# Patient Record
Sex: Female | Born: 1995 | Hispanic: No | Marital: Single
Health system: Southern US, Community
[De-identification: ages and names within clinical notes are randomized; demographics above are authoritative.]

## PROBLEM LIST (undated history)

## (undated) DIAGNOSIS — J45909 Unspecified asthma, uncomplicated: Secondary | ICD-10-CM

## (undated) DIAGNOSIS — E282 Polycystic ovarian syndrome: Secondary | ICD-10-CM

---

## 2017-05-19 ENCOUNTER — Emergency Department: Payer: 59

## 2017-05-19 ENCOUNTER — Encounter: Payer: Self-pay | Admitting: Emergency Medicine

## 2017-05-19 ENCOUNTER — Emergency Department
Admission: EM | Admit: 2017-05-19 | Discharge: 2017-05-19 | Disposition: A | Discharge status: Home / Self Care | Payer: 59 | Attending: Emergency Medicine | Admitting: Emergency Medicine

## 2017-05-19 DIAGNOSIS — A419 Sepsis, unspecified organism: Secondary | ICD-10-CM | POA: Insufficient documentation

## 2017-05-19 DIAGNOSIS — R000 Tachycardia, unspecified: Secondary | ICD-10-CM | POA: Insufficient documentation

## 2017-05-19 DIAGNOSIS — I959 Hypotension, unspecified: Secondary | ICD-10-CM | POA: Diagnosis not present

## 2017-05-19 DIAGNOSIS — J45909 Unspecified asthma, uncomplicated: Secondary | ICD-10-CM | POA: Diagnosis not present

## 2017-05-19 DIAGNOSIS — R509 Fever, unspecified: Secondary | ICD-10-CM | POA: Diagnosis present

## 2017-05-19 HISTORY — DX: Unspecified asthma, uncomplicated: J45.909

## 2017-05-19 HISTORY — DX: Polycystic ovarian syndrome: E28.2

## 2017-05-19 LAB — CBC WITH DIFFERENTIAL/PLATELET
BASOS PCT: 0 %
Basophils Absolute: 0 10*3/uL (ref 0–0.1)
EOS ABS: 0 10*3/uL (ref 0–0.7)
EOS PCT: 0 %
HCT: 33.4 % — ABNORMAL LOW (ref 35.0–47.0)
HEMOGLOBIN: 11 g/dL — AB (ref 12.0–16.0)
LYMPHS ABS: 1.2 10*3/uL (ref 1.0–3.6)
Lymphocytes Relative: 8 %
MCH: 24.6 pg — AB (ref 26.0–34.0)
MCHC: 32.9 g/dL (ref 32.0–36.0)
MCV: 74.9 fL — ABNORMAL LOW (ref 80.0–100.0)
MONO ABS: 0.5 10*3/uL (ref 0.2–0.9)
MONOS PCT: 3 %
Neutro Abs: 13.5 10*3/uL — ABNORMAL HIGH (ref 1.4–6.5)
Neutrophils Relative %: 89 %
Platelets: 372 10*3/uL (ref 150–440)
RBC: 4.46 MIL/uL (ref 3.80–5.20)
RDW: 16.3 % — AB (ref 11.5–14.5)
WBC: 15.2 10*3/uL — ABNORMAL HIGH (ref 3.6–11.0)

## 2017-05-19 LAB — URINALYSIS, COMPLETE (UACMP) WITH MICROSCOPIC
Bacteria, UA: NONE SEEN
Bilirubin Urine: NEGATIVE
GLUCOSE, UA: NEGATIVE mg/dL
KETONES UR: 20 mg/dL — AB
Leukocytes, UA: NEGATIVE
NITRITE: NEGATIVE
PH: 6 (ref 5.0–8.0)
PROTEIN: 100 mg/dL — AB
Specific Gravity, Urine: 1.032 — ABNORMAL HIGH (ref 1.005–1.030)

## 2017-05-19 LAB — COMPREHENSIVE METABOLIC PANEL
ALK PHOS: 49 U/L (ref 38–126)
ALT: 10 U/L — AB (ref 14–54)
AST: 24 U/L (ref 15–41)
Albumin: 3.8 g/dL (ref 3.5–5.0)
Anion gap: 9 (ref 5–15)
BUN: 12 mg/dL (ref 6–20)
CALCIUM: 8.9 mg/dL (ref 8.9–10.3)
CHLORIDE: 103 mmol/L (ref 101–111)
CO2: 24 mmol/L (ref 22–32)
CREATININE: 0.81 mg/dL (ref 0.44–1.00)
GFR calc non Af Amer: >60 mL/min (ref >60)
GLUCOSE: 107 mg/dL — AB (ref 65–99)
Potassium: 3.7 mmol/L (ref 3.5–5.1)
SODIUM: 136 mmol/L (ref 135–145)
Total Bilirubin: 0.7 mg/dL (ref 0.3–1.2)
Total Protein: 8.3 g/dL — ABNORMAL HIGH (ref 6.5–8.1)

## 2017-05-19 LAB — MONONUCLEOSIS SCREEN: MONO SCREEN: NEGATIVE

## 2017-05-19 LAB — POCT PREGNANCY, URINE: Preg Test, Ur: NEGATIVE

## 2017-05-19 LAB — LACTIC ACID, PLASMA: LACTIC ACID, VENOUS: 1.7 mmol/L (ref 0.5–1.9)

## 2017-05-19 MED ORDER — VANCOMYCIN HCL 10 G IV SOLR: 1000.0000 mg | Freq: Once | INTRAVENOUS | Status: DC

## 2017-05-19 MED ORDER — PIPERACILLIN-TAZOBACTAM 3.375 G IVPB 30 MIN
INTRAVENOUS | Status: AC
  Administered 2017-05-19: 3.375 g via INTRAVENOUS
  Filled 2017-05-19: qty 50

## 2017-05-19 MED ORDER — SODIUM CHLORIDE 0.9 % IV BOLUS (SEPSIS)
1000.0000 mL | Freq: Once | INTRAVENOUS | Status: AC
  Administered 2017-05-19: 1000 mL via INTRAVENOUS

## 2017-05-19 MED ORDER — VANCOMYCIN HCL IN DEXTROSE 1-5 GM/200ML-% IV SOLN
1000.0000 mg | Freq: Once | INTRAVENOUS | Status: AC
  Administered 2017-05-19: 1000 mg via INTRAVENOUS
  Filled 2017-05-19: qty 200

## 2017-05-19 MED ORDER — ONDANSETRON HCL 4 MG/2ML IJ SOLN
4.0000 mg | Freq: Once | INTRAMUSCULAR | Status: AC
  Administered 2017-05-19: 4 mg via INTRAVENOUS
  Filled 2017-05-19: qty 2

## 2017-05-19 MED ORDER — ACETAMINOPHEN 500 MG PO TABS
1000.0000 mg | ORAL_TABLET | Freq: Once | ORAL | Status: AC
  Administered 2017-05-19: 1000 mg via ORAL
  Filled 2017-05-19: qty 2

## 2017-05-19 MED ORDER — CIPROFLOXACIN HCL 250 MG PO TABS: 250.0000 mg | ORAL_TABLET | Freq: Two times a day (BID) | ORAL | 0 refills | Status: DC

## 2017-05-19 MED ORDER — CIPROFLOXACIN HCL 250 MG PO TABS: 250.0000 mg | ORAL_TABLET | Freq: Two times a day (BID) | ORAL | 0 refills | Status: AC

## 2017-05-19 MED ORDER — PIPERACILLIN-TAZOBACTAM 3.375 G IVPB 30 MIN
INTRAVENOUS | Status: AC
  Filled 2017-05-19: qty 50

## 2017-05-19 MED ORDER — KETOROLAC TROMETHAMINE 30 MG/ML IJ SOLN
30.0000 mg | Freq: Once | INTRAMUSCULAR | Status: AC
  Administered 2017-05-19: 30 mg via INTRAVENOUS
  Filled 2017-05-19: qty 1

## 2017-05-19 MED ORDER — PIPERACILLIN-TAZOBACTAM 3.375 G IVPB 30 MIN
3.3750 g | Freq: Once | INTRAVENOUS | Status: AC
  Administered 2017-05-19: 3.375 g via INTRAVENOUS

## 2017-05-19 NOTE — ED Provider Notes (Signed)
[facility] Emergency Department Provider Note  ____________________________________________  Time seen: Approximately 12:13 PM  I have reviewed the triage vital signs and the nursing notes.   HISTORY  Chief Complaint Fever and Generalized Body Aches    HPI Laurie Sampson is a 21 y.o. female with a history of asthma and PCOS sent from clinic for hypotension, fever, tachycardia. The patient reports that since yesterday she has been feeling generalized body aches with malaise and fatigue. She did have one episode of nausea and vomiting but has not had any abdominal pain, diarrhea or constipation. She is not had any cough or cold symptoms, sore throat, ear pain, urinary frequency or dysuria. She did travel to [place] in [place] one month ago. No known sick contacts, rash or tick bites. The patient is menstruating at this time but is not wearing a tampon. At the urgent care, the patient had a blood pressure reading as low as 60/40, temperature of 88.6, and heart rate of 132.   Past Medical History:  Diagnosis Date  . Asthma   . Polycystic ovarian syndrome     There are no active problems to display for this patient.   History reviewed. No pertinent surgical history.  Current Outpatient Rx  . Order #: [id number] Class: Historical Med    Allergies Other  No family history on file.  Social History Social History  Substance Use Topics  . Smoking status: Not on file  . Smokeless tobacco: Not on file  . Alcohol use Not on file    Review of Systems Constitutional:Positive fever and chills. Positive generalized body aches. Positive generalized malaise. Eyes: No visual changes. No eye discharge. ENT: No sore throat. No congestion or rhinorrhea. No ear pain. Cardiovascular: Denies chest pain. Denies palpitations. Respiratory: Denies shortness of breath.  No cough. Gastrointestinal: No abdominal pain.  Positive nausea, positive single episode of vomiting.  No  diarrhea.  No constipation. Genitourinary: Negative for dysuria. Urinary frequency. No hematuria. Musculoskeletal: Negative for focal back pain. Skin: Negative for rash. Neurological: Negative for headaches. No focal numbness, tingling or weakness.     ____________________________________________   PHYSICAL EXAM:  VITAL SIGNS: ED Triage Vitals [05/19/17 1036]  Enc Vitals Group     BP 101/60     Pulse Rate (!) 134     Resp 20     Temp (!) 102.8 F (39.3 C)     Temp Source Oral     SpO2 97 %     Weight 100 lb (45.4 kg)     Height 4\' 11"  (1.499 m)     Head Circumference      Peak Flow      Pain Score 8     Pain Loc      Pain Edu?      Excl. in GC?     Constitutional: Alert and oriented. Well appearing and in no acute distress. Answers questions appropriately.Lying comfortably in the stretcher, able to sit up, and speaking comfortably. Eyes: Conjunctivae are normal.  EOMI. No scleral icterus. No eye discharge. Head: Atraumatic. Nose: No congestion/rhinnorhea. Mouth/Throat: Mucous membranes are moist. No posterior pharyngeal erythema, tonsillar swelling or exudate. The posterior palate is symmetric and uvula is midline. No evidence of dental infection. Neck: No stridor.  Supple.  No meningismus. Full range of motion without pain. Cardiovascular: Fast rate, regular rhythm. No murmurs, rubs or gallops.  Respiratory: Normal respiratory effort.  No accessory muscle use or retractions. Lungs CTAB.  No wheezes, rales or ronchi.  Gastrointestinal: Soft, nontender and nondistended.  No guarding or rebound.  No peritoneal signs. Musculoskeletal: No LE edema. No swollen or erythematous joints. The patient has diffuse back pain that is not focal in the costovertebral angles. Neurologic:  A&Ox3.  Speech is clear.  Face and smile are symmetric.  EOMI.  Moves all extremities well. Skin:  Skin is warm, dry and intact. No rash noted. Psychiatric: Mood and affect are normal. Speech and behavior  are normal.  Normal judgement.  ____________________________________________   LABS (all labs ordered are listed, but only abnormal results are displayed)  Labs Reviewed  COMPREHENSIVE METABOLIC PANEL - Abnormal; Notable for the following:       Result Value   Glucose, Bld 107 (*)    Total Protein 8.3 (*)    ALT 10 (*)    All other components within normal limits  CBC WITH DIFFERENTIAL/PLATELET - Abnormal; Notable for the following:    WBC 15.2 (*)    Hemoglobin 11.0 (*)    HCT 33.4 (*)    MCV 74.9 (*)    MCH 24.6 (*)    RDW 16.3 (*)    Neutro Abs 13.5 (*)    All other components within normal limits  URINALYSIS, COMPLETE (UACMP) WITH MICROSCOPIC - Abnormal; Notable for the following:    Color, Urine YELLOW (*)    APPearance HAZY (*)    Specific Gravity, Urine 1.032 (*)    Hgb urine dipstick MODERATE (*)    Ketones, ur 20 (*)    Protein, ur 100 (*)    Squamous Epithelial / LPF 0-5 (*)    Non Squamous Epithelial 0-5 (*)    All other components within normal limits  CULTURE, BLOOD (ROUTINE X 2)  CULTURE, BLOOD (ROUTINE X 2)  LACTIC ACID, PLASMA  LACTIC ACID, PLASMA  MONONUCLEOSIS SCREEN  POC URINE PREG, ED  POCT PREGNANCY, URINE   ____________________________________________  EKG  ED ECG REPORT I, Rockne Menghini, the attending physician, personally viewed and interpreted this ECG.   Date: 05/19/2017  EKG Time: 1129  Rate: 120  Rhythm: normal EKG, normal sinus rhythm, unchanged from previous tracings, sinus tachycardia  Axis: normal  Intervals:none  ST&T Change: No STEMI  ____________________________________________  RADIOLOGY  Dg Chest 2 View  Result Date: 05/19/2017 CLINICAL DATA:  Fever with dizziness and nausea EXAM: CHEST  2 VIEW COMPARISON:  None. FINDINGS: Lungs are clear. Heart size and pulmonary vascularity are normal. No adenopathy. There is mild midthoracic levoscoliosis. IMPRESSION: No edema or consolidation. Electronically Signed   By:  Bretta Bang III M.D.   On: 05/19/2017 11:27    ____________________________________________   PROCEDURES  Procedure(s) performed: None  Procedures  Critical Care performed: Yes, see critical care note(s) ____________________________________________   INITIAL IMPRESSION / ASSESSMENT AND PLAN / ED COURSE  Pertinent labs & imaging results that were available during my care of the patient were reviewed by me and considered in my medical decision making (see chart for details).  21 y.o. female, otherwise healthy, presenting with fever, hypotension and tachycardia, 1 episode of vomiting. Overall, she does not have an obvious focal source of infection. There is no evidence for pulmonary or HEENT infection. She is not wearing a tampon although she is menstruating so toxic shock syndrome is unlikely. She had 1 episode of vomiting, but without any diarrhea or abdominal pain, and acute intra-abdominal infection is also unlikely unless her symptoms of fever or preceding GI symptoms. I'm awaiting the results of her urinalysis but she  is not having any urinary symptoms at this time. The patient will receive broad-spectrum antibiotics if her urinalysis is negative and be admitted to the hospital for further evaluation and treatment.  ----------------------------------------- 12:47 PM on 05/19/2017 -----------------------------------------  The patient's urinalysis does not show infection. Broad-spectrum antibiotics have been ordered. The patient will be admitted to the hospital for further evaluation and treatment.  CRITICAL CARE Performed by: Rockne Menghini   Total critical care time: 35 minutes  Critical care time was exclusive of separately billable procedures and treating other patients.  Critical care was necessary to treat or prevent imminent or life-threatening deterioration.  Critical care was time spent personally by me on the following activities: development of  treatment plan with patient and/or surrogate as well as nursing, discussions with consultants, evaluation of patient's response to treatment, examination of patient, obtaining history from patient or surrogate, ordering and performing treatments and interventions, ordering and review of laboratory studies, ordering and review of radiographic studies, pulse oximetry and re-evaluation of patient's condition.   ____________________________________________  FINAL CLINICAL IMPRESSION(S) / ED DIAGNOSES  Final diagnoses:  Sepsis, due to unspecified organism [facility])         NEW MEDICATIONS STARTED DURING THIS VISIT:  New Prescriptions   No medications on file      Rockne Menghini, MD 05/19/17 1248

## 2017-05-19 NOTE — ED Triage Notes (Signed)
Pt reports fever and generalized body aches that began yesterday. [facility] where [facility] was [facility] and tachycardic. Pt recently returned from [place] and [place].

## 2017-05-19 NOTE — Consult Note (Signed)
[facility] [place] [facility]   PATIENT NAME: Laurie Sampson    MR#:  [id number]  DATE OF BIRTH:  [birth year]  DATE OF CONSULT:  05/19/2017  PRIMARY CARE PHYSICIAN: System, Pcp Not In   REQUESTING/REFERRING PHYSICIAN: Dr. Sharma Covert.   CHIEF COMPLAINT:   Chief Complaint  Patient presents with  . Fever  . Generalized Body Aches    HISTORY OF PRESENT ILLNESS:  Laurie Sampson  is a 21 y.o. female with a known history of polycystic ovarian syndrome, asthma who presents to the hospital due to fever and generalized body aches. Patient developed these symptoms over the past 24 hours. [facility] and was noted to be severely hypotensive and therefore sent to the ER for further evaluation. Patient was started on a sepsis protocol and given IV fluids and broad-spectrum IV antibiotics with vancomycin, Zosyn. Hospitalist services were contacted further treatment and evaluation. Patient does admit to some nausea and vomiting earlier today but no diarrhea. She denies any abdominal pain, chest pains, shortness of breath, cough congestion or any other upper respiratory symptoms. She denies any recent sick contacts. She denies any neck stiffness, blurry vision, nuchal rigidity.    PAST MEDICAL HISTORY:   Past Medical History:  Diagnosis Date  . Asthma   . Polycystic ovarian syndrome     PAST SURGICAL HISTOIRY:  History reviewed. No pertinent surgical history.  SOCIAL HISTORY:   Social History  Substance Use Topics  . Smoking status: Not on file  . Smokeless tobacco: Not on file  . Alcohol use Not on file    FAMILY HISTORY:  No family history on file.  No significant Family hx of DM, or HTN.    DRUG ALLERGIES:   Allergies  Allergen Reactions  . Other Anaphylaxis    Sesame seeds    REVIEW OF SYSTEMS:   Review of Systems  Constitutional: Positive for fever and malaise/fatigue. Negative for weight loss.  HENT: Negative for congestion,  nosebleeds and tinnitus.   Eyes: Negative for blurred vision, double vision and redness.  Respiratory: Negative for cough, hemoptysis and shortness of breath.   Cardiovascular: Negative for chest pain, orthopnea, leg swelling and PND.  Gastrointestinal: Negative for abdominal pain, diarrhea, melena, nausea and vomiting.  Genitourinary: Negative for dysuria, hematuria and urgency.  Musculoskeletal: Negative for falls and joint pain.  Neurological: Negative for dizziness, tingling, sensory change, focal weakness, seizures, weakness and headaches.  Endo/Heme/Allergies: Negative for polydipsia. Does not bruise/bleed easily.  Psychiatric/Behavioral: Negative for depression and memory loss. The patient is not nervous/anxious.      MEDICATIONS AT HOME:   Prior to Admission medications   Medication Sig Start Date End Date Taking? Authorizing Provider  JUNEL 1.5/30 1.5-30 MG-MCG tablet Take 1 tablet by mouth daily. 05/06/17  Yes [provider]  ciprofloxacin (CIPRO) 250 MG tablet Take 1 tablet (250 mg total) by mouth 2 (two) times daily. 05/19/17 05/26/17  Houston Siren, MD      VITAL SIGNS:  Blood pressure 108/86, pulse 90, temperature 99.5 F (37.5 C), temperature source Oral, resp. rate 17, height 4\' 11"  (1.499 m), weight 45.4 kg (100 lb), last menstrual period 04/22/2017, SpO2 100 %.  PHYSICAL EXAMINATION:  GENERAL:  21 y.o.-year-old patient lying in the bed with no acute distress.  EYES: Pupils equal, round, reactive to light and accommodation. No scleral icterus. Extraocular muscles intact.  HEENT: Head atraumatic, normocephalic. Oropharynx and nasopharynx clear.  NECK:  Supple, no jugular venous  distention. No thyroid enlargement, no tenderness.  LUNGS: Normal breath sounds bilaterally, no wheezing, rales, rhonchi . No use of accessory muscles of respiration.  CARDIOVASCULAR: S1, S2, RRR. No murmurs, rubs, gallops, clicks.  ABDOMEN: Soft, nontender, nondistended. Bowel sounds  present. No organomegaly or mass.  EXTREMITIES: No pedal edema, cyanosis, or clubbing.  NEUROLOGIC: Cranial nerves II through XII are intact. No focal motor or sensory deficits appreciated bilaterally  PSYCHIATRIC: The patient is alert and oriented x 3.  SKIN: No obvious rash, lesion, or ulcer.   LABORATORY PANEL:   CBC  Recent Labs Lab 05/19/17 1045  WBC 15.2*  HGB 11.0*  HCT 33.4*  PLT 372   ------------------------------------------------------------------------------------------------------------------  Chemistries   Recent Labs Lab 05/19/17 1045  NA 136  K 3.7  CL 103  CO2 24  GLUCOSE 107*  BUN 12  CREATININE 0.81  CALCIUM 8.9  AST 24  ALT 10*  ALKPHOS 49  BILITOT 0.7   ------------------------------------------------------------------------------------------------------------------  Cardiac Enzymes No results for input(s): TROPONINI in the last 168 hours. ------------------------------------------------------------------------------------------------------------------  RADIOLOGY:  Dg Chest 2 View  Result Date: 05/19/2017 CLINICAL DATA:  Fever with dizziness and nausea EXAM: CHEST  2 VIEW COMPARISON:  None. FINDINGS: Lungs are clear. Heart size and pulmonary vascularity are normal. No adenopathy. There is mild midthoracic levoscoliosis. IMPRESSION: No edema or consolidation. Electronically Signed   By: Bretta Bang III M.D.   On: 05/19/2017 11:27     IMPRESSION AND PLAN:   21 year old female with past medical history polycystic ovarian syndrome, asthma who presents to the hospital due to generalized body aches and fever.  1. Sepsis-patient meets criteria given fever, leukocytosis but the source of sepsis is still very unclear. Patient's chest x-rays negative for acute pathology, urinalysis is negative for urinary tract infection. - She has no recent sick contacts, no evidence of sexually transmitted disease, no clinical symptoms of meningitis. - She  could have a nonspecific gastroenteritis which could either be bacterial or viral in nature given the patient's leukocytosis with neutrophilic predominance. - I will empirically discharge the patient on 7 days of ciprofloxacin. Patient has received 1 dose of IV Zosyn and vancomycin on the ER. Her fever has subsided, her blood pressure has significantly improved with IV fluid hydration. She clinically feels much better.  Discussed with the patient's mother and she is in agreement but patient being discharged home on oral antibiotics and supportive care. Advised mother that if the patient gets worse to bring the patient back to the ER and she is agreeable with this plan.    All the records are reviewed and case discussed with Consulting provider. Management plans discussed with the patient, family and they are in agreement.  CODE STATUS: Full code  TOTAL TIME TAKING CARE OF THIS PATIENT: 40 minutes.    Houston Siren M.D on 05/19/2017 at 3:03 PM  Between 7am to 6pm - Pager - [phone]  After 6pm go to [email or web address] - password [email or web address]  [facility]  Office  [phone]  CC: Primary care Physician: System, Pcp Not In

## 2017-05-19 NOTE — ED Notes (Signed)
Pt and mother verbalize understanding of d/ teaching and rx. Pt in NAD at time of d/c, VS stable. Pt mother signed per pt request

## 2017-05-19 NOTE — Progress Notes (Signed)
Pharmacy Code Sepsis Note  Laurie Sampson is a 21 y.o. female admitted on 05/19/2017 with fever and body aches currently being evaluated for possible sepsis. Code Sepsis called at 1111.   Plan: Spoke to RN regarding lack of antibiotic orders for this patient. Per RN, MD will see patient shortly to determine plan for antibiotics.   Height: 4\' 11"  (149.9 cm) Weight: 100 lb (45.4 kg) IBW/kg (Calculated) : 43.2  Temp (24hrs), Avg:102.8 F (39.3 C), Min:102.8 F (39.3 C), Max:102.8 F (39.3 C)   Recent Labs Lab 05/19/17 1045  WBC 15.2*  CREATININE 0.81  LATICACIDVEN 1.7     Valentina Gu 05/19/2017 12:05 PM

## 2017-05-19 NOTE — ED Notes (Signed)
Pt taken to xray via stretcher  

## 2017-05-19 NOTE — ED Notes (Signed)
One set blood cultures sent 

## 2017-05-19 NOTE — ED Notes (Signed)
ED Provider at bedside. 

## 2017-05-24 LAB — CULTURE, BLOOD (ROUTINE X 2)
Culture: NO GROWTH
Culture: NO GROWTH
Special Requests: ADEQUATE

## 2017-12-21 IMAGING — CR DG CHEST 2V
1 series · 2 of 2 positions shown · non-contrast
Comparison: None.

CLINICAL DATA: Fever with dizziness and nausea

EXAM:
CHEST  2 VIEW

[Series 1: dg chest 2 view · 0.14mm/px · 2 of 2 slices shown]
[im 1/2]
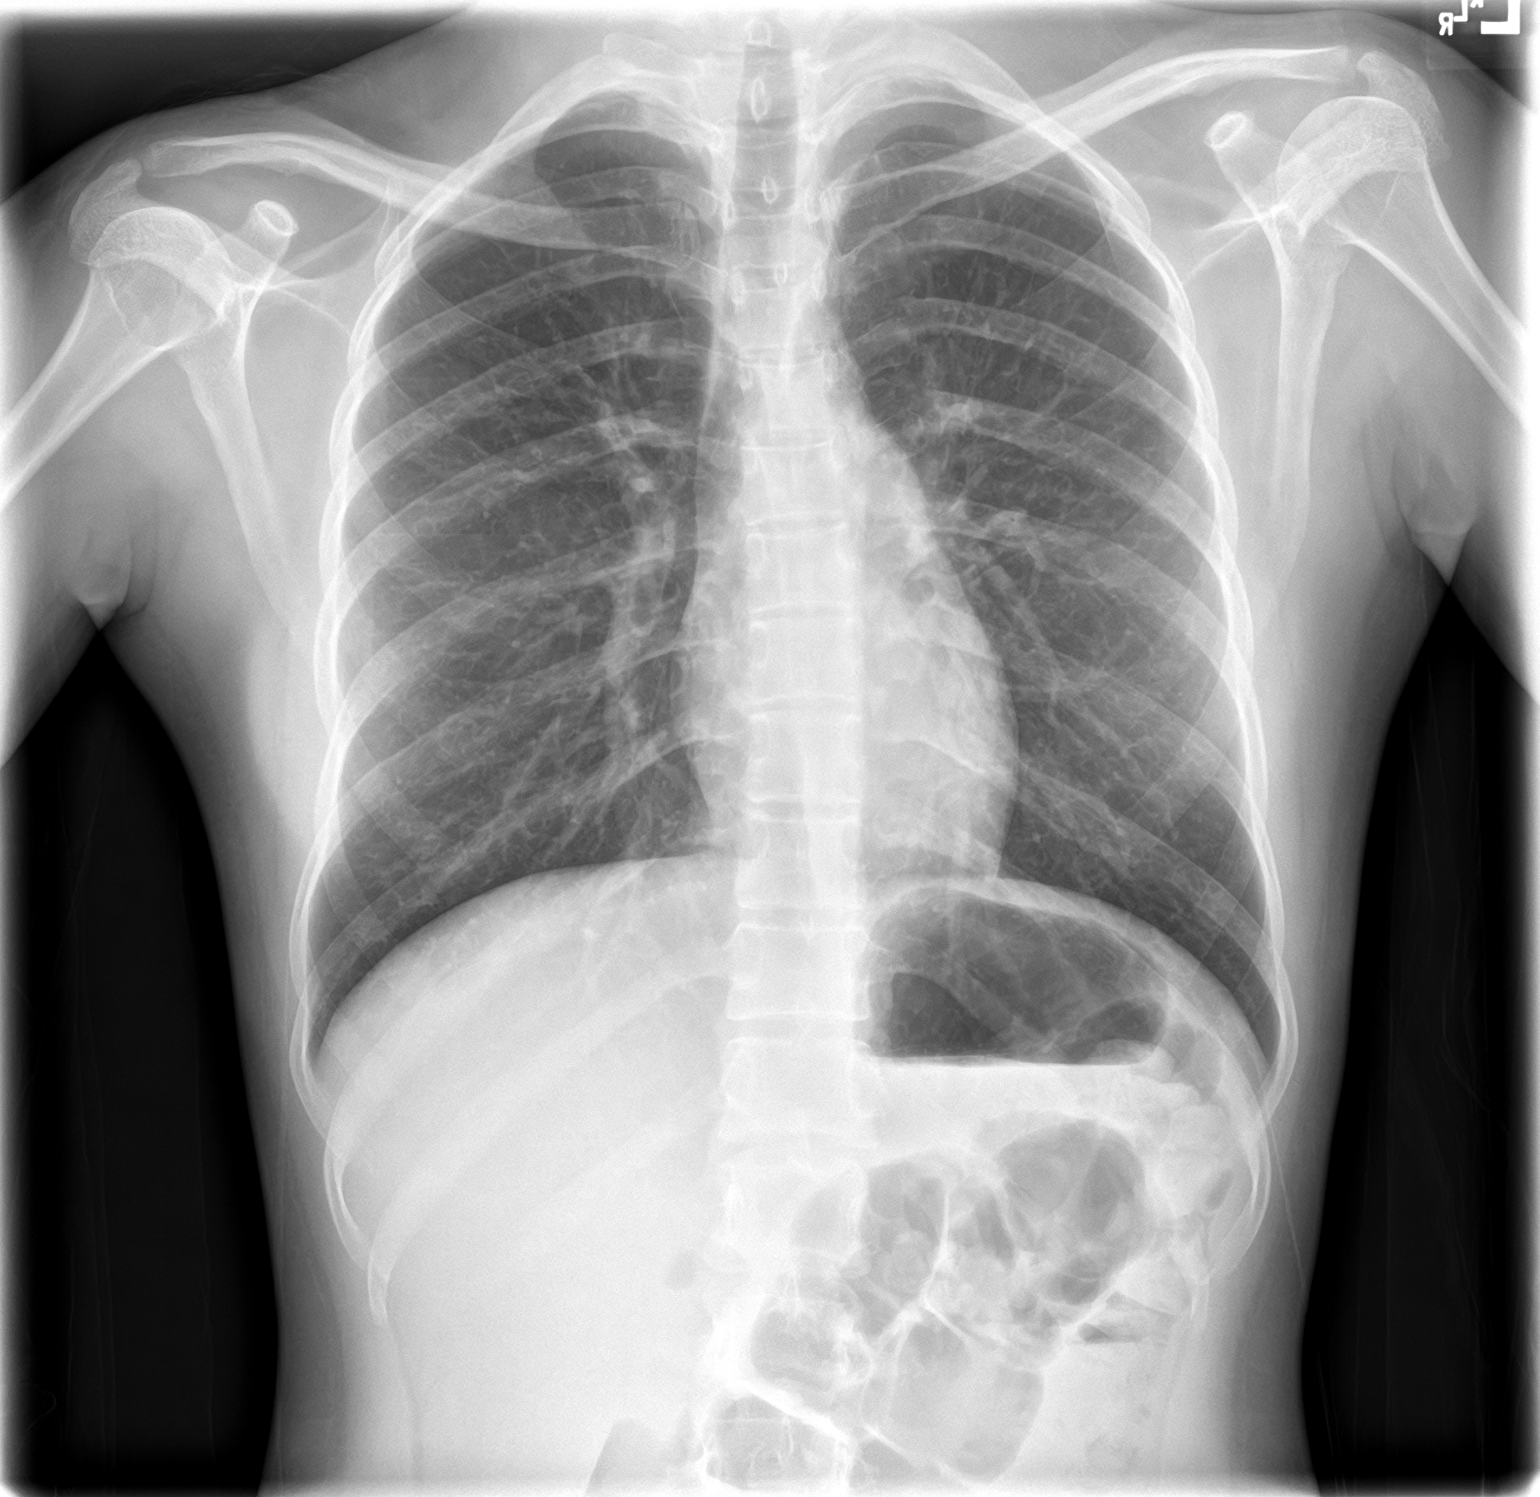
[im 2/2]
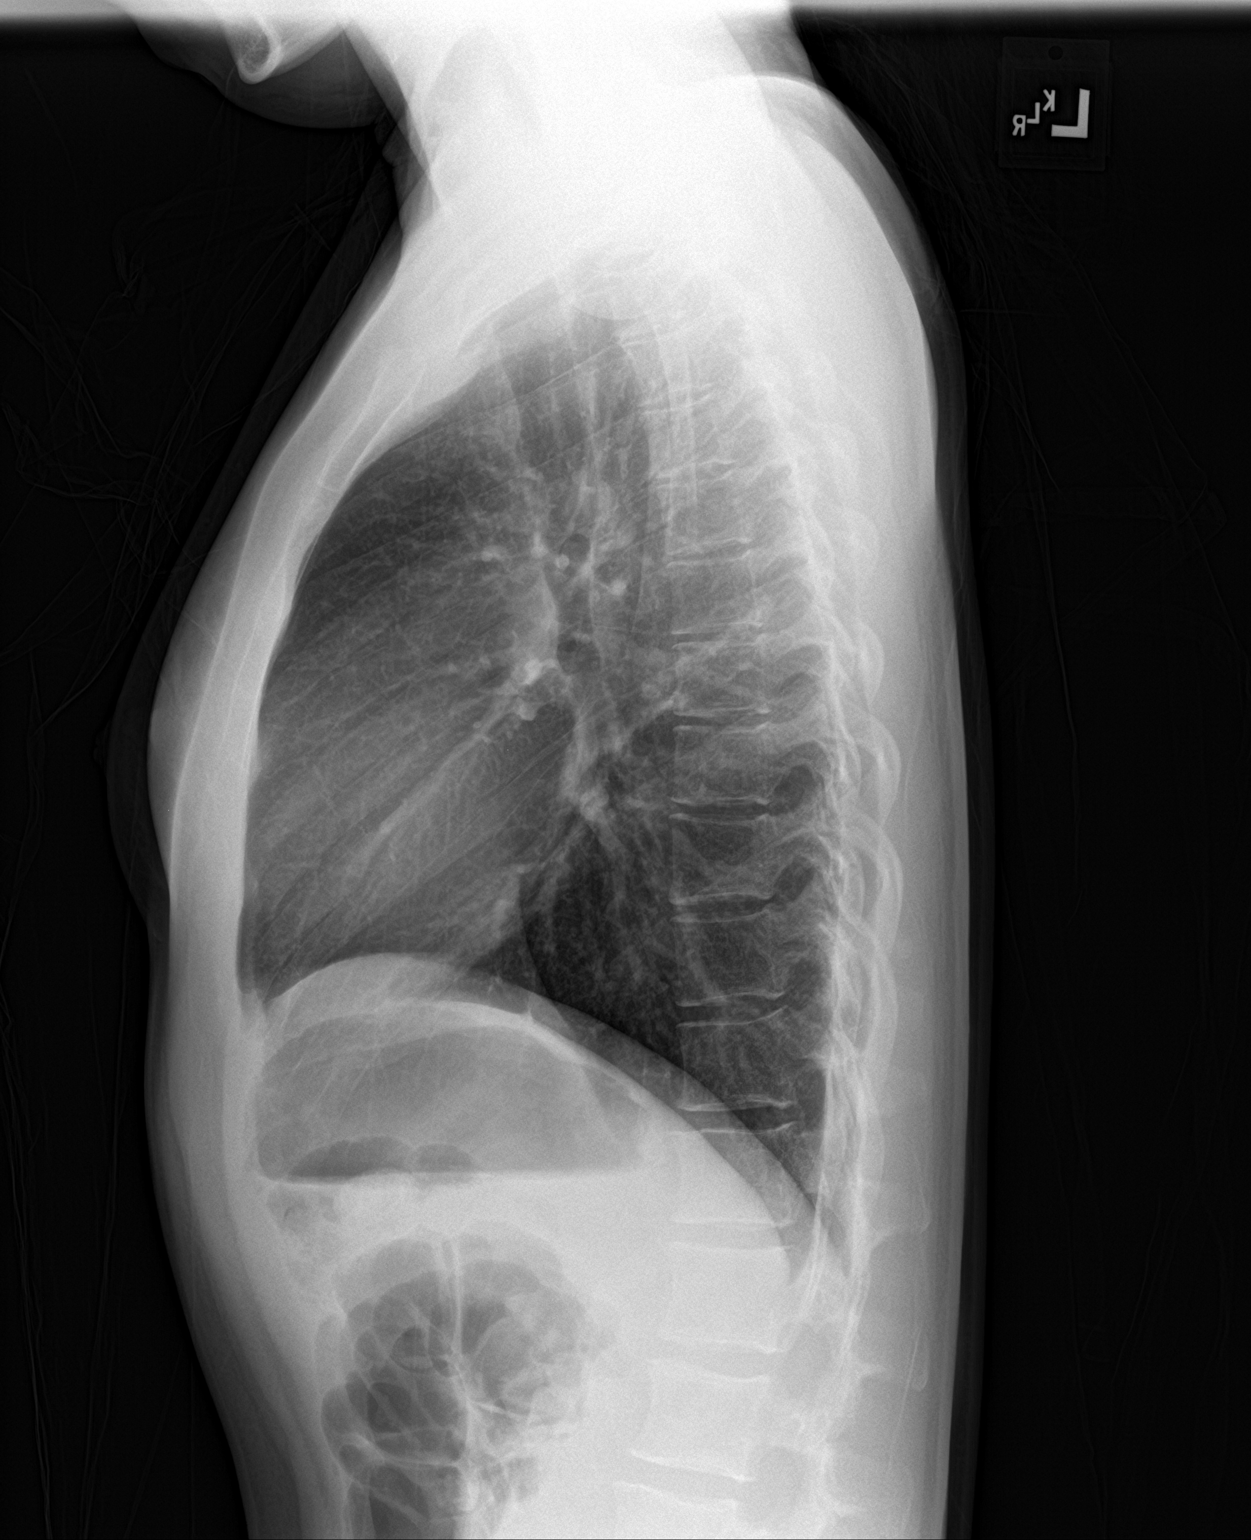

[2 of 2 positions shown; findings below may reference images not displayed]

FINDINGS: Lungs are clear. Heart size and pulmonary vascularity are normal. No
adenopathy. There is mild midthoracic levoscoliosis.
IMPRESSION: No edema or consolidation.
